# Patient Record
Sex: Female | Born: 1975 | Race: Black or African American | Hispanic: No | Smoking: Former smoker
Health system: Southern US, Community
[De-identification: ages and names within clinical notes are randomized; demographics above are authoritative.]

## PROBLEM LIST (undated history)

## (undated) DIAGNOSIS — Z789 Other specified health status: Secondary | ICD-10-CM

## (undated) HISTORY — PX: NO PAST SURGERIES: SHX2092

---

## 2016-09-23 ENCOUNTER — Encounter (HOSPITAL_BASED_OUTPATIENT_CLINIC_OR_DEPARTMENT_OTHER): Payer: Self-pay | Admitting: Emergency Medicine

## 2016-09-23 ENCOUNTER — Emergency Department (HOSPITAL_BASED_OUTPATIENT_CLINIC_OR_DEPARTMENT_OTHER): Payer: Medicaid Other

## 2016-09-23 ENCOUNTER — Emergency Department (HOSPITAL_BASED_OUTPATIENT_CLINIC_OR_DEPARTMENT_OTHER)
Admission: EM | Admit: 2016-09-23 | Discharge: 2016-09-23 | Disposition: A | Discharge status: Home / Self Care | Payer: Medicaid Other | Attending: Emergency Medicine | Admitting: Emergency Medicine

## 2016-09-23 DIAGNOSIS — Z3A11 11 weeks gestation of pregnancy: Secondary | ICD-10-CM | POA: Diagnosis not present

## 2016-09-23 DIAGNOSIS — O99011 Anemia complicating pregnancy, first trimester: Secondary | ICD-10-CM | POA: Insufficient documentation

## 2016-09-23 DIAGNOSIS — O200 Threatened abortion: Secondary | ICD-10-CM | POA: Insufficient documentation

## 2016-09-23 DIAGNOSIS — O209 Hemorrhage in early pregnancy, unspecified: Secondary | ICD-10-CM | POA: Diagnosis present

## 2016-09-23 LAB — WET PREP, GENITAL
CLUE CELLS WET PREP: NONE SEEN
Sperm: NONE SEEN
Trich, Wet Prep: NONE SEEN
YEAST WET PREP: NONE SEEN

## 2016-09-23 LAB — ABO/RH: ABO/RH(D): O POS

## 2016-09-23 LAB — BASIC METABOLIC PANEL
Anion gap: 8 (ref 5–15)
BUN: 12 mg/dL (ref 6–20)
CALCIUM: 8.8 mg/dL — AB (ref 8.9–10.3)
CO2: 22 mmol/L (ref 22–32)
Chloride: 106 mmol/L (ref 101–111)
Creatinine, Ser: 0.84 mg/dL (ref 0.44–1.00)
GLUCOSE: 104 mg/dL — AB (ref 65–99)
POTASSIUM: 3.7 mmol/L (ref 3.5–5.1)
SODIUM: 136 mmol/L (ref 135–145)

## 2016-09-23 LAB — CBC WITH DIFFERENTIAL/PLATELET
BASOS ABS: 0 10*3/uL (ref 0.0–0.1)
BASOS PCT: 0 %
Eosinophils Absolute: 0.2 10*3/uL (ref 0.0–0.7)
Eosinophils Relative: 3 %
HEMATOCRIT: 34.6 % — AB (ref 36.0–46.0)
Hemoglobin: 11.4 g/dL — ABNORMAL LOW (ref 12.0–15.0)
LYMPHS PCT: 40 %
Lymphs Abs: 3.2 10*3/uL (ref 0.7–4.0)
MCH: 25.4 pg — ABNORMAL LOW (ref 26.0–34.0)
MCHC: 32.9 g/dL (ref 30.0–36.0)
MCV: 77.1 fL — ABNORMAL LOW (ref 78.0–100.0)
MONO ABS: 0.6 10*3/uL (ref 0.1–1.0)
Monocytes Relative: 8 %
NEUTROS ABS: 3.9 10*3/uL (ref 1.7–7.7)
NEUTROS PCT: 49 %
Platelets: 327 10*3/uL (ref 150–400)
RBC: 4.49 MIL/uL (ref 3.87–5.11)
RDW: 17.7 % — AB (ref 11.5–15.5)
WBC: 8 10*3/uL (ref 4.0–10.5)

## 2016-09-23 LAB — HCG, QUANTITATIVE, PREGNANCY: hCG, Beta Chain, Quant, S: 7020 m[IU]/mL — ABNORMAL HIGH (ref <5)

## 2016-09-23 LAB — URINALYSIS, ROUTINE W REFLEX MICROSCOPIC
Bilirubin Urine: NEGATIVE
GLUCOSE, UA: NEGATIVE mg/dL
KETONES UR: NEGATIVE mg/dL
Nitrite: NEGATIVE
PROTEIN: NEGATIVE mg/dL
Specific Gravity, Urine: 1.009 (ref 1.005–1.030)
pH: 6 (ref 5.0–8.0)

## 2016-09-23 LAB — URINALYSIS, MICROSCOPIC (REFLEX)

## 2016-09-23 MED ORDER — PRENATAL COMPLETE 14-0.4 MG PO TABS: 1.0000 | ORAL_TABLET | Freq: Every day | ORAL | 0 refills | Status: AC

## 2016-09-23 NOTE — ED Triage Notes (Signed)
Pt states she is [redacted] weeks pregnant per [facility]. Pt states tonight she went to bathroom and there was blood on tissue.

## 2016-09-23 NOTE — ED Provider Notes (Signed)
[facility] Provider Note   CSN: [id number] Arrival date & time: 09/23/16  0425     History   Chief Complaint Chief Complaint  Patient presents with  . Vaginal Bleeding    HPI Jasmine Douglas is a 41 y.o. female.  The history is provided by the patient.  She is pregnant with last menstrual period 07/06/2016-confirmed by test [facility]. She is gravida 2, para 0 with one voluntary and option of pregnancy. She is scheduled to start prenatal care next week. Tonight, she urinated and noted some blood on the tissue paper when she wiped. She denies any pelvic pain or cramping, but has had some slight comfortably in the left inguinal area. She relates that she flew from [place] into [place] yesterday. She states she is blood type O+.  History reviewed. No pertinent past medical history.  There are no active problems to display for this patient.   History reviewed. No pertinent surgical history.  OB History    Gravida Para Term Preterm AB Living   1             SAB TAB Ectopic Multiple Live Births                   Home Medications    Prior to Admission medications   Not on File    Family History No family history on file.  Social History Social History  Substance Use Topics  . Smoking status: Not on file  . Smokeless tobacco: Not on file  . Alcohol use Not on file     Allergies   Aspirin   Review of Systems Review of Systems  All other systems reviewed and are negative.    Physical Exam Updated Vital Signs BP (!) 164/114 (BP Location: Right Arm)   Pulse 87   Temp 98.6 F (37 C) (Oral)   Resp 16   SpO2 100%   Physical Exam  Nursing note and vitals reviewed.  41 year old female, resting comfortably and in no acute distress. Vital signs are significant for hypertension. Oxygen saturation is 100%, which is normal. Head is normocephalic and atraumatic. PERRLA, EOMI. Oropharynx is clear. Neck is nontender and supple  without adenopathy or JVD. Back is nontender and there is no CVA tenderness. Lungs are clear without rales, wheezes, or rhonchi. Chest is nontender. Heart has regular rate and rhythm without murmur. Abdomen is soft, flat, nontender without masses or hepatosplenomegaly and peristalsis is normoactive. Pelvic: Normal external female genitalia. Small amount of brownish debris is present in the vaginal vault. Cervix is closed. No active bleeding. Fundus is approximately 12 weeks size and nontender. No adnexal masses or tenderness. Extremities have no cyanosis or edema, full range of motion is present. Skin is warm and dry without rash. Neurologic: Mental status is normal, cranial nerves are intact, there are no motor or sensory deficits.  ED Treatments / Results  Labs (all labs ordered are listed, but only abnormal results are displayed) Labs Reviewed  WET PREP, GENITAL - Abnormal; Notable for the following:       Result Value   WBC, Wet Prep HPF POC MANY (*)    All other components within normal limits  BASIC METABOLIC PANEL - Abnormal; Notable for the following:    Glucose, Bld 104 (*)    Calcium 8.8 (*)    All other components within normal limits  CBC WITH DIFFERENTIAL/PLATELET - Abnormal; Notable for the following:    Hemoglobin 11.4 (*)  HCT 34.6 (*)    MCV 77.1 (*)    MCH 25.4 (*)    RDW 17.7 (*)    All other components within normal limits  HCG, QUANTITATIVE, PREGNANCY - Abnormal; Notable for the following:    hCG, Beta Chain, Quant, S 7,020 (*)    All other components within normal limits  URINALYSIS, ROUTINE W REFLEX MICROSCOPIC - Abnormal; Notable for the following:    APPearance CLOUDY (*)    Hgb urine dipstick LARGE (*)    Leukocytes, UA TRACE (*)    All other components within normal limits  URINALYSIS, MICROSCOPIC (REFLEX) - Abnormal; Notable for the following:    Bacteria, UA FEW (*)    Squamous Epithelial / LPF 6-30 (*)    All other components within normal  limits  RPR  HIV ANTIBODY (ROUTINE TESTING)  ABO/RH  GC/CHLAMYDIA PROBE AMP ([place]) NOT AT [facility]    Radiology No results found.  Procedures Procedures (including critical care time)  Medications Ordered in ED Medications - No data to display   Initial Impression / Assessment and Plan / ED Course  I have reviewed the triage vital signs and the nursing notes.  Pertinent labs & imaging results that were available during my care of the patient were reviewed by me and considered in my medical decision making (see chart for details).  First trimester bleeding. We'll check quantitative hCG and will send for pelvic ultrasound. Case is signed out to Dr. Judd Lien to evaluate ultrasound results.  Final Clinical Impressions(s) / ED Diagnoses   Final diagnoses:  Microcytic anemia    New Prescriptions New Prescriptions   PRENATAL VIT-FE FUMARATE-FA (PRENATAL COMPLETE) 14-0.4 MG TABS    Take 1 tablet by mouth daily.     Dione Booze, MD 09/23/16 [phone]

## 2016-09-23 NOTE — ED Notes (Signed)
ED Provider at bedside. 

## 2016-09-23 NOTE — ED Notes (Signed)
Awaiting U/S results , husband at bedside

## 2016-09-23 NOTE — ED Notes (Signed)
Patient denies pain and is resting comfortably.  

## 2016-09-23 NOTE — ED Notes (Signed)
Pt ambulatory to bathroom. Pt and visitor offered drink and snack. Pt will wait in ED until US arrives. Pt expresses no other needs at this time.

## 2016-09-23 NOTE — ED Provider Notes (Signed)
Care assumed from Dr. Preston FleetingGlick at shift change. Patient is a 41 year old female first trimester pregnant who presents with vaginal bleeding. Her beta hCG is 7000 and is awaiting an ultrasound to rule out ectopic. This was performed and shows a fetal pole, however no identifiable cardiac activity concerning for failed pregnancy.  I have informed the patient of the ultrasound findings and have advised her To make an appointment wih her OB in the next 3 days for repeat beta hCG and possible repeat ultrasound. I have advised vaginal rest and no strenuous activity in the interim.   Jasmine Douglas, Jasmine [place], MD 09/23/16 [phone]

## 2016-09-23 NOTE — ED Notes (Signed)
Patient transported to Ultrasound 

## 2016-09-24 ENCOUNTER — Inpatient Hospital Stay (HOSPITAL_COMMUNITY)
Admission: AD | Admit: 2016-09-24 | Discharge: 2016-09-24 | Disposition: A | Discharge status: Home / Self Care | Payer: Medicaid Other | Source: Ambulatory Visit | Attending: Family Medicine | Admitting: Family Medicine

## 2016-09-24 ENCOUNTER — Encounter (HOSPITAL_COMMUNITY): Payer: Self-pay

## 2016-09-24 ENCOUNTER — Inpatient Hospital Stay (HOSPITAL_COMMUNITY): Payer: Medicaid Other

## 2016-09-24 DIAGNOSIS — O209 Hemorrhage in early pregnancy, unspecified: Secondary | ICD-10-CM | POA: Diagnosis present

## 2016-09-24 DIAGNOSIS — O4692 Antepartum hemorrhage, unspecified, second trimester: Secondary | ICD-10-CM

## 2016-09-24 DIAGNOSIS — O4690 Antepartum hemorrhage, unspecified, unspecified trimester: Secondary | ICD-10-CM

## 2016-09-24 DIAGNOSIS — O039 Complete or unspecified spontaneous abortion without complication: Secondary | ICD-10-CM | POA: Diagnosis not present

## 2016-09-24 DIAGNOSIS — Z87891 Personal history of nicotine dependence: Secondary | ICD-10-CM | POA: Diagnosis not present

## 2016-09-24 DIAGNOSIS — Z6790 Unspecified blood type, Rh positive: Secondary | ICD-10-CM

## 2016-09-24 HISTORY — DX: Other specified health status: Z78.9

## 2016-09-24 LAB — GC/CHLAMYDIA PROBE AMP (~~LOC~~) NOT AT ARMC
CHLAMYDIA, DNA PROBE: NEGATIVE
Neisseria Gonorrhea: NEGATIVE

## 2016-09-24 LAB — HIV ANTIBODY (ROUTINE TESTING W REFLEX): HIV SCREEN 4TH GENERATION: NONREACTIVE

## 2016-09-24 LAB — HCG, QUANTITATIVE, PREGNANCY: hCG, Beta Chain, Quant, S: 3806 m[IU]/mL — ABNORMAL HIGH (ref <5)

## 2016-09-24 LAB — CBC
HEMATOCRIT: 36.3 % (ref 36.0–46.0)
HEMOGLOBIN: 12.2 g/dL (ref 12.0–15.0)
MCH: 25.6 pg — ABNORMAL LOW (ref 26.0–34.0)
MCHC: 33.6 g/dL (ref 30.0–36.0)
MCV: 76.3 fL — ABNORMAL LOW (ref 78.0–100.0)
Platelets: 342 10*3/uL (ref 150–400)
RBC: 4.76 MIL/uL (ref 3.87–5.11)
RDW: 17.4 % — AB (ref 11.5–15.5)
WBC: 12.8 10*3/uL — AB (ref 4.0–10.5)

## 2016-09-24 LAB — RPR: RPR Ser Ql: NONREACTIVE

## 2016-09-24 MED ORDER — IBUPROFEN 600 MG PO TABS
600.0000 mg | ORAL_TABLET | Freq: Four times a day (QID) | ORAL | Status: DC | PRN
  Administered 2016-09-24: 600 mg via ORAL
  Filled 2016-09-24: qty 1

## 2016-09-24 NOTE — Discharge Instructions (Signed)

## 2016-09-24 NOTE — MAU Note (Signed)
Pt received by EMS with c/o vaginal bleeding which started about 24 hours ago. Having cramping now as well. Was seen yesterday morning at [facility] [place], US done. "Sac visualized in the uterus but no heartbeat." Did blood work and urine test as well.

## 2016-09-24 NOTE — MAU Provider Note (Signed)
History     CSN: [id number]  Arrival date and time: 09/24/16 [id number]   First Provider Initiated Contact with Patient 09/24/16 [phone]      Chief Complaint  Patient presents with  . Vaginal Bleeding   [id number] @[redacted]w[redacted]d  by sure LMP here with lower abdominal cramping since 11 pm. Rates pain 7/10. Took Tylenol and had no relief. She also reports heavy VB since yesterday. She was seen in another ED yesterday for VB and found to have IUP with CRL 6868w0d and no cardiac activity.   OB History    Gravida Para Term Preterm AB Living   2       1     SAB TAB Ectopic Multiple Live Births     1            Past Medical History:  Diagnosis Date  . Medical history non-contributory     Past Surgical History:  Procedure Laterality Date  . NO PAST SURGERIES      No family history on file.  Social History  Substance Use Topics  . Smoking status: Former Games developermoker  . Smokeless tobacco: Never Used  . Alcohol use No    Allergies:  Allergies  Allergen Reactions  . Aspirin Other (See Comments)    GI upset    Prescriptions Prior to Admission  Medication Sig Dispense Refill Last Dose  . Prenatal Vit-Fe Fumarate-FA (PRENATAL COMPLETE) 14-0.4 MG TABS Take 1 tablet by mouth daily. 30 each 0     Review of Systems  Constitutional: Negative for fever.  Gastrointestinal: Positive for abdominal pain, nausea and vomiting.  Genitourinary: Positive for vaginal bleeding.   Physical Exam   Blood pressure 139/73, pulse 67, temperature 98.8 F (37.1 C), temperature source Oral, resp. rate 18, last menstrual period 07/04/2016, SpO2 100 %.  Physical Exam  Constitutional: She is oriented to person, place, and time. She appears well-developed and well-nourished. No distress.  HENT:  Head: Normocephalic and atraumatic.  Neck: Normal range of motion.  Cardiovascular: Normal rate.   Respiratory: Effort normal.  GI: Soft. She exhibits no distension. There is no tenderness.  Genitourinary:  Genitourinary  Comments: External: no lesions or erythema Vagina: rugated, nulli, small drk bloody discharge, POC protruding from introitus Uterus: + enlarged, anteverted, non tender, no CMT Adnexae: no masses, no tenderness left, no tenderness right   Musculoskeletal: Normal range of motion.  Neurological: She is alert and oriented to person, place, and time.  Skin: Skin is warm and dry.  Psychiatric: She has a normal mood and affect.   Results for orders placed or performed during the hospital encounter of 09/24/16 (from the past 24 hour(s))  CBC     Status: Abnormal   Collection Time: 09/24/16  7:38 AM  Result Value Ref Range   WBC 12.8 (H) 4.0 - 10.5 K/uL   RBC 4.76 3.87 - 5.11 MIL/uL   Hemoglobin 12.2 12.0 - 15.0 g/dL   HCT 47.836.3 29.536.0 - 62.146.0 %   MCV 76.3 (L) 78.0 - 100.0 fL   MCH 25.6 (L) 26.0 - 34.0 pg   MCHC 33.6 30.0 - 36.0 g/dL   RDW 30.817.4 (H) 65.711.5 - 84.615.5 %   Platelets 342 150 - 400 K/uL   [place] Ob Comp Less 14 Wks  Result Date: 09/23/2016 CLINICAL DATA:  Vaginal spotting EXAM: OBSTETRIC <14 WK US AND TRANSVAGINAL OB US TECHNIQUE: Both transabdominal and transvaginal ultrasound examinations were performed for complete evaluation of the gestation as well as the maternal uterus,  adnexal regions, and pelvic cul-de-sac. Transvaginal technique was performed to assess early pregnancy. COMPARISON:  None. FINDINGS: Intrauterine gestational sac: Visualized Yolk sac:  Not visualized Embryo:  Visualized Cardiac Activity: Not visualized CRL:  3  mm   6 w   0 d Subchorionic hemorrhage:  None visualized. Maternal uterus/adnexae: Leiomyomatous change is noted in the uterus. In the posterior fundal region, there is a 5.4 x 4.2 x 5.9 cm presumed leiomyoma. Toward the left, there is a 2.7 x 1.7 x 0.0 cm mass. More anteriorly, there is a 1.9 x 1.1 cm intrauterine mass, felt to be consistent with leiomyoma. Neither maternal ovary visualized. No extrauterine mass evident. No free pelvic fluid. IMPRESSION: Within the  uterus, there is a small fetal pole measuring 3 mm in length without heart activity apparent. Findings are suspicious but not yet definitive for failed pregnancy. Recommend follow-up US in 10-14 days for definitive diagnosis. This recommendation follows SRU consensus guidelines: Diagnostic Criteria for Nonviable Pregnancy Early in the First Trimester. Malva Limes Med 2013; 161:0960-45. Based on crown-rump length, estimated gestational age is 6 weeks. Leiomyomatous uterus as noted above. No subchorionic hemorrhage evident. Neither maternal ovary could be visualized. Electronically Signed   By: Bretta Bang III M.D.   On: 09/23/2016 08:43   US Ob Transvaginal  Result Date: 09/24/2016 CLINICAL DATA:  Pregnant, vaginal bleeding and pain, follow-up, having a lot of pain and earlier passed a large clot EXAM: TRANSVAGINAL OB ULTRASOUND TECHNIQUE: Transvaginal ultrasound was performed for complete evaluation of the gestation as well as the maternal uterus, adnexal regions, and pelvic cul-de-sac. COMPARISON:  09/23/2016 FINDINGS: Intrauterine gestational sac: Absent, no longer identified Yolk sac:  N/A Embryo:  N/A Cardiac Activity: N/A Heart Rate: N/A bpm Maternal uterus/adnexae: Thickened endometrial complex with previously identified gestational sac no longer visualized. Neither ovary is visualized question due to obscuration by bowel as well as mildly enlarged uterus. No free pelvic fluid or adnexal masses. IMPRESSION: Previously identified gestational sac is no longer seen consistent with spontaneous abortion. Thickened and mildly heterogeneous endometrial complex, may contain small amount of blood. Electronically Signed   By: Ulyses Southward M.D.   On: 09/24/2016 07:52   US Ob Transvaginal  Result Date: 09/23/2016 CLINICAL DATA:  Vaginal spotting EXAM: OBSTETRIC <14 WK [place] AND TRANSVAGINAL OB US TECHNIQUE: Both transabdominal and transvaginal ultrasound examinations were performed for complete evaluation of the  gestation as well as the maternal uterus, adnexal regions, and pelvic cul-de-sac. Transvaginal technique was performed to assess early pregnancy. COMPARISON:  None. FINDINGS: Intrauterine gestational sac: Visualized Yolk sac:  Not visualized Embryo:  Visualized Cardiac Activity: Not visualized CRL:  3  mm   6 w   0 d Subchorionic hemorrhage:  None visualized. Maternal uterus/adnexae: Leiomyomatous change is noted in the uterus. In the posterior fundal region, there is a 5.4 x 4.2 x 5.9 cm presumed leiomyoma. Toward the left, there is a 2.7 x 1.7 x 0.0 cm mass. More anteriorly, there is a 1.9 x 1.1 cm intrauterine mass, felt to be consistent with leiomyoma. Neither maternal ovary visualized. No extrauterine mass evident. No free pelvic fluid. IMPRESSION: Within the uterus, there is a small fetal pole measuring 3 mm in length without heart activity apparent. Findings are suspicious but not yet definitive for failed pregnancy. Recommend follow-up US in 10-14 days for definitive diagnosis. This recommendation follows SRU consensus guidelines: Diagnostic Criteria for Nonviable Pregnancy Early in the First Trimester. Malva Limes Med 2013; 409:8119-14. Based on crown-rump  length, estimated gestational age is 6 weeks. Leiomyomatous uterus as noted above. No subchorionic hemorrhage evident. Neither maternal ovary could be visualized. Electronically Signed   By: Bretta Bang III M.D.   On: 09/23/2016 08:43   MAU Course  Procedures  MDM Labs and [place] ordered and reviewed. SAB complete. POCs sent to pathology. Stable for discharge home.  Assessment and Plan   1. SAB (spontaneous abortion)   2. Vaginal bleeding in pregnancy   3. Blood type, Rh positive    Discharge home Follow up with OB in [place] in 2 weeks Return/bleeding precautions Ibuprofen prn  Allergies as of 09/24/2016      Reactions   Aspirin Other (See Comments)   GI upset      Medication List    TAKE these medications   PRENATAL COMPLETE  14-0.4 MG Tabs Take 1 tablet by mouth daily.      Donette Larry, CNM 09/24/2016, 6:47 AM

## 2017-07-03 ENCOUNTER — Encounter (HOSPITAL_COMMUNITY): Payer: Self-pay

## 2018-07-20 IMAGING — US US OB TRANSVAGINAL
1 series · 15 of 24 positions shown · non-contrast
Comparison: 09/23/2016

CLINICAL DATA: Pregnant, vaginal bleeding and pain, follow-up,
having a lot of pain and earlier passed a large clot

EXAM:
TRANSVAGINAL OB ULTRASOUND
TECHNIQUE: Transvaginal ultrasound was performed for complete evaluation of the
gestation as well as the maternal uterus, adnexal regions, and
pelvic cul-de-sac.

[Series 1: us ob transvaginal · 24 acquisitions, 15 frames shown]
[im 1/24]
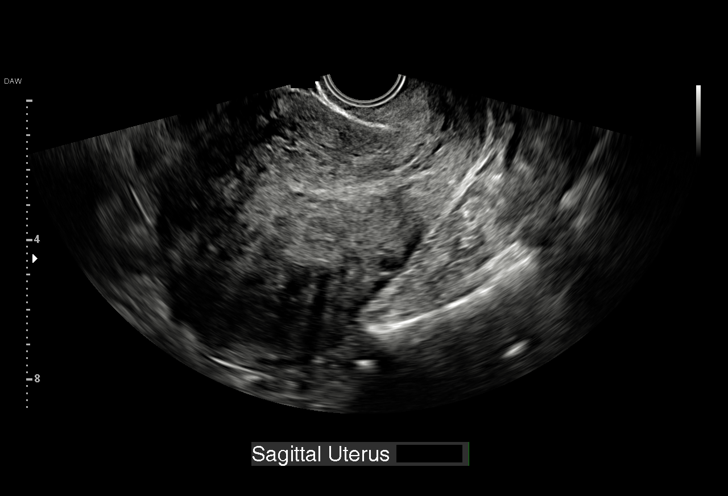
[im 3/24]
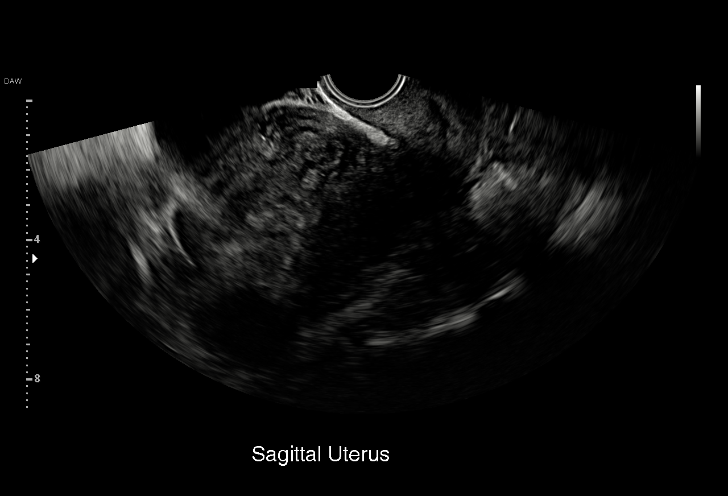
[im 5/24]
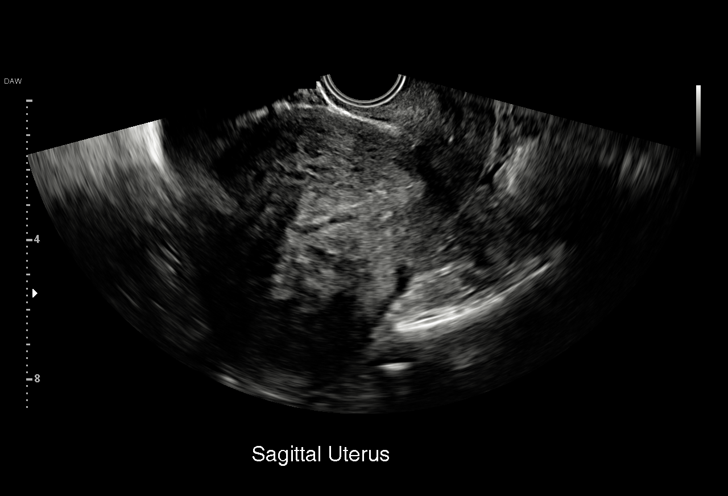
[im 6/24]
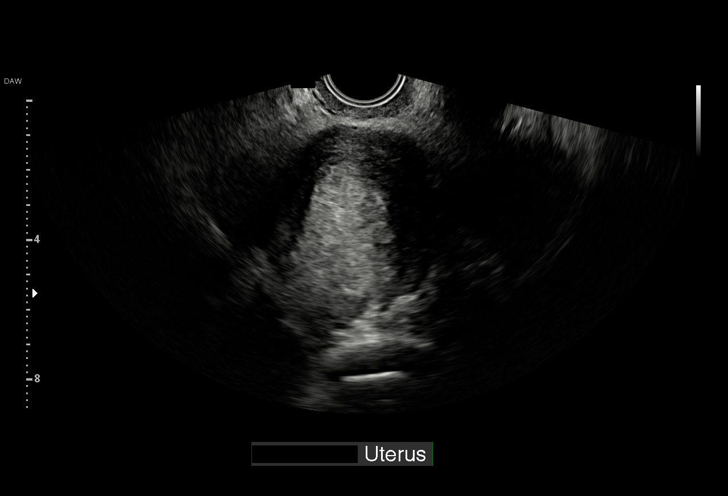
[im 8/24]
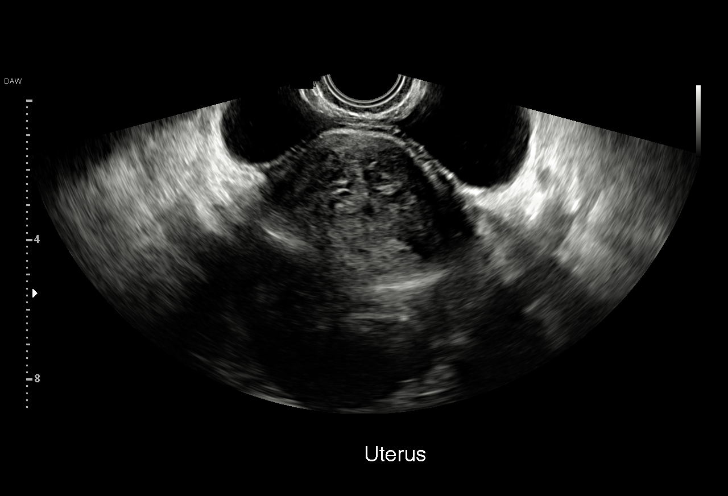
[im 9/24]
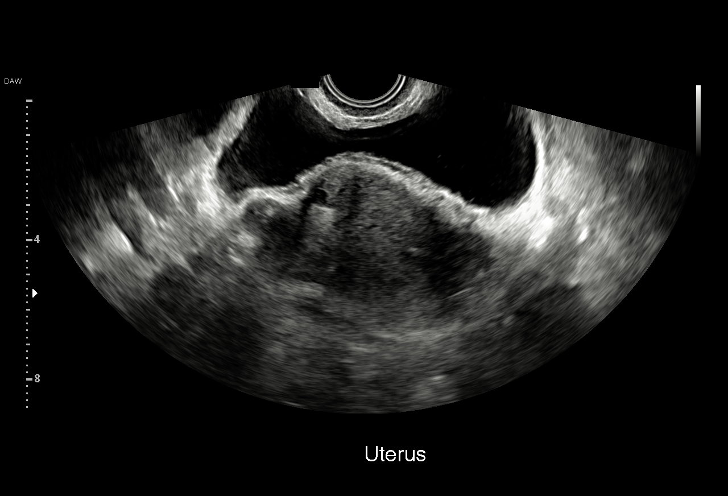
[im 11/24]
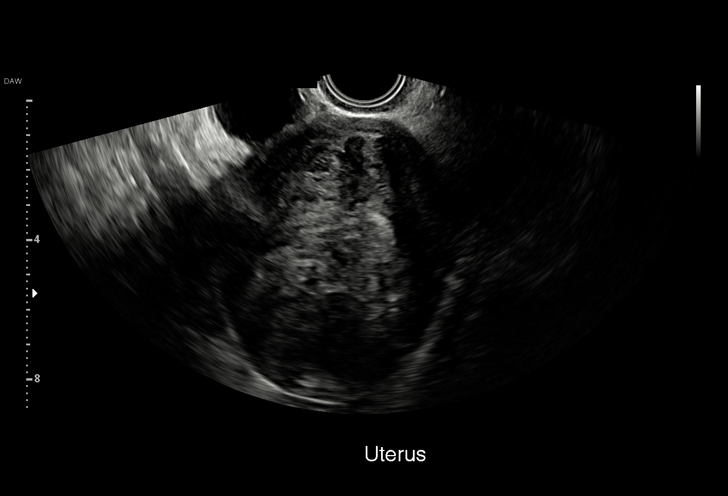
[im 13/24]
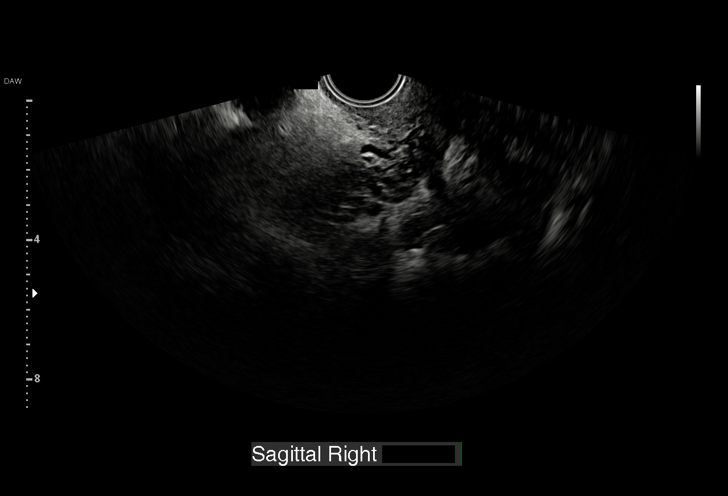
[im 14/24]
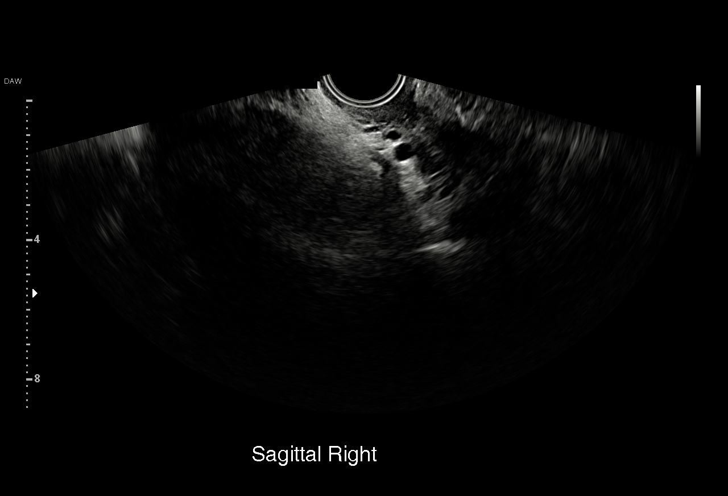
[im 16/24]
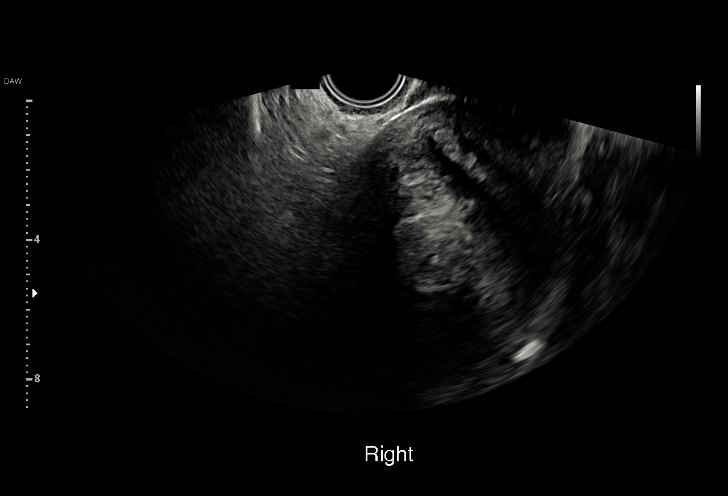
[im 17/24]
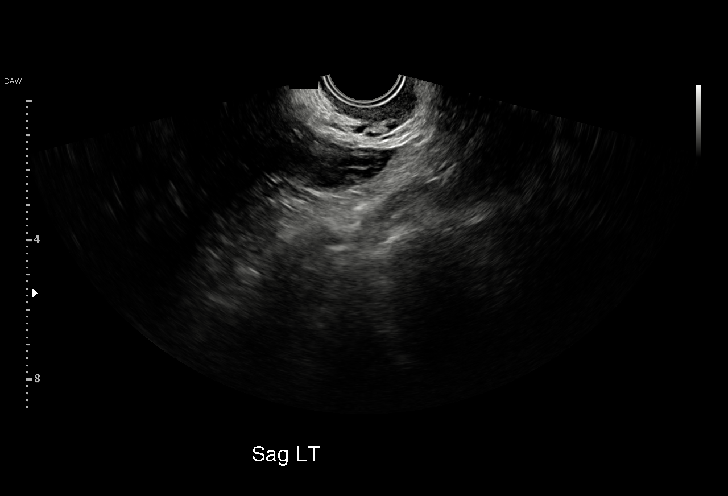
[im 19/24]
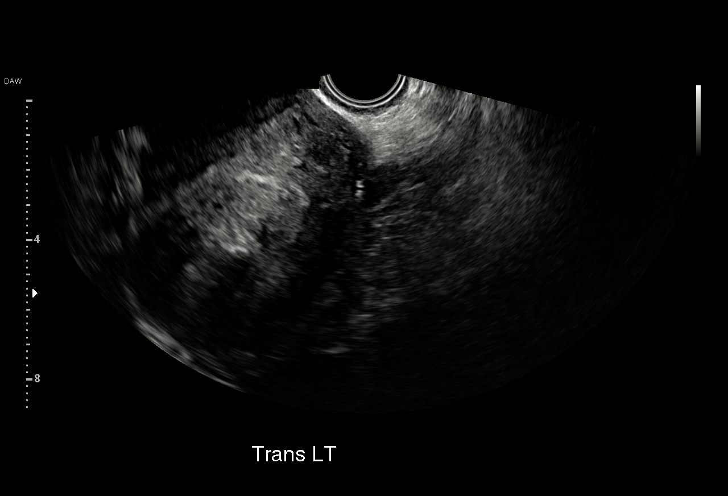
[im 21/24]
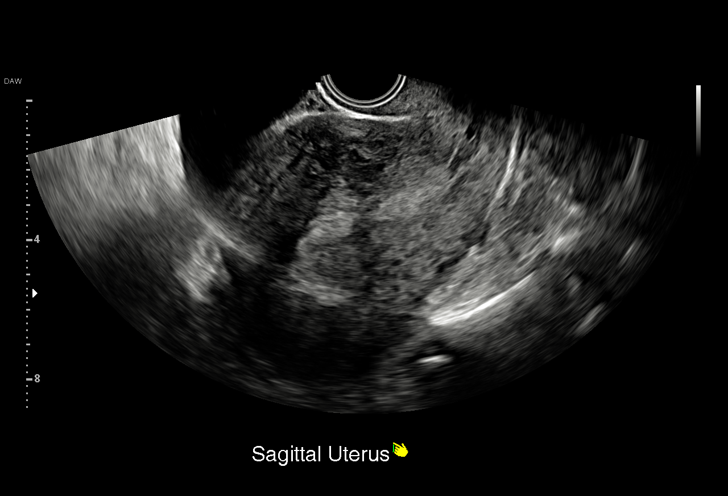
[im 22/24]
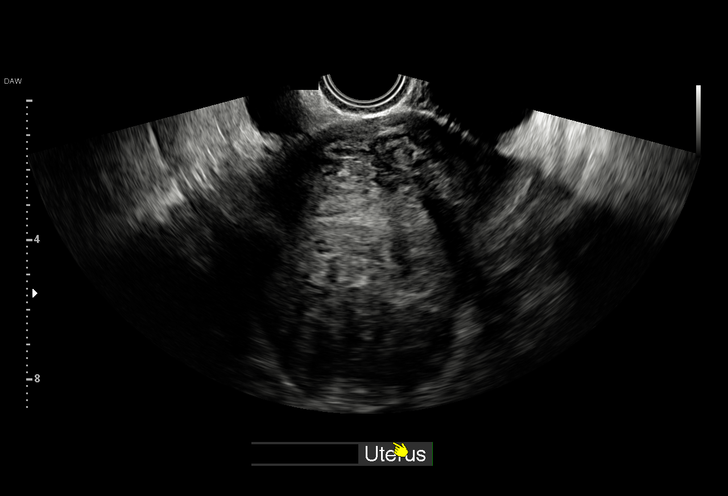
[im 24/24]
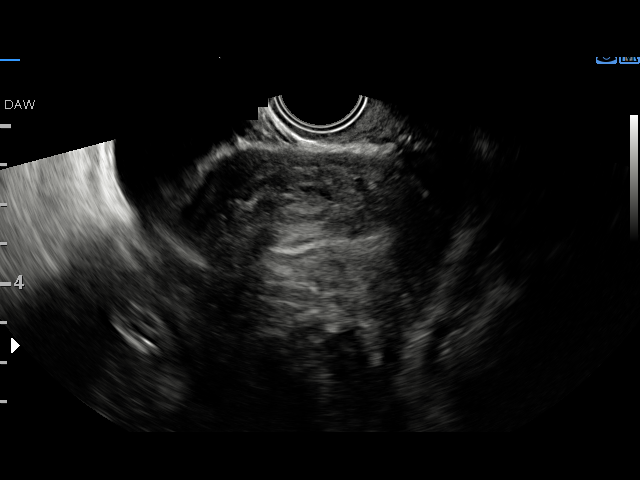

[15 of 24 positions shown; findings below may reference images not displayed]

FINDINGS: Intrauterine gestational sac: Absent, no longer identified

Yolk sac:  N/A

Embryo:  N/A

Cardiac Activity: N/A

Heart Rate: N/A bpm

Maternal uterus/adnexae:

Thickened endometrial complex with previously identified gestational
sac no longer visualized.

Neither ovary is visualized question due to obscuration by bowel as
well as mildly enlarged uterus.

No free pelvic fluid or adnexal masses.
IMPRESSION: Previously identified gestational sac is no longer seen consistent
with spontaneous abortion.

Thickened and mildly heterogeneous endometrial complex, may contain
small amount of blood.

## 2019-05-01 IMAGING — US US OB TRANSVAGINAL
1 series · 13 of 28 positions shown · non-contrast
Comparison: None.

CLINICAL DATA: Vaginal spotting

EXAM:
OBSTETRIC <14 WK US AND TRANSVAGINAL OB US
TECHNIQUE: Both transabdominal and transvaginal ultrasound examinations were
performed for complete evaluation of the gestation as well as the
maternal uterus, adnexal regions, and pelvic cul-de-sac.
Transvaginal technique was performed to assess early pregnancy.

[Series 1: us ob transvaginal · 0.21mm/px · 13 of 50 slices shown]
[im 2/50]
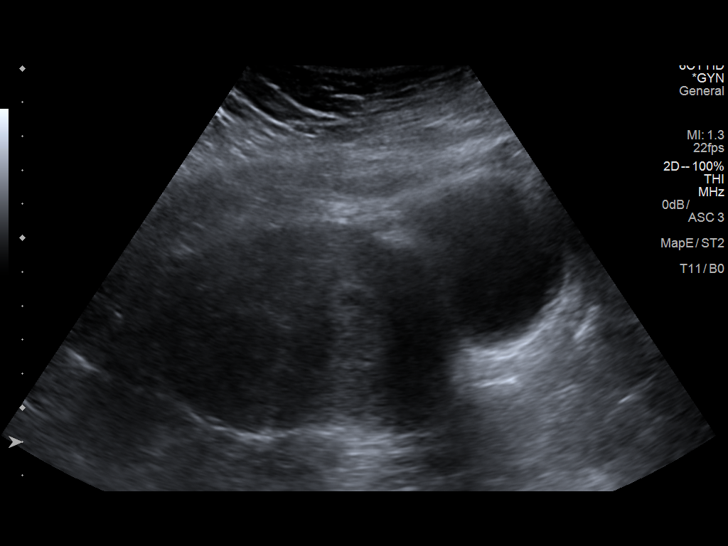
[im 6/50]
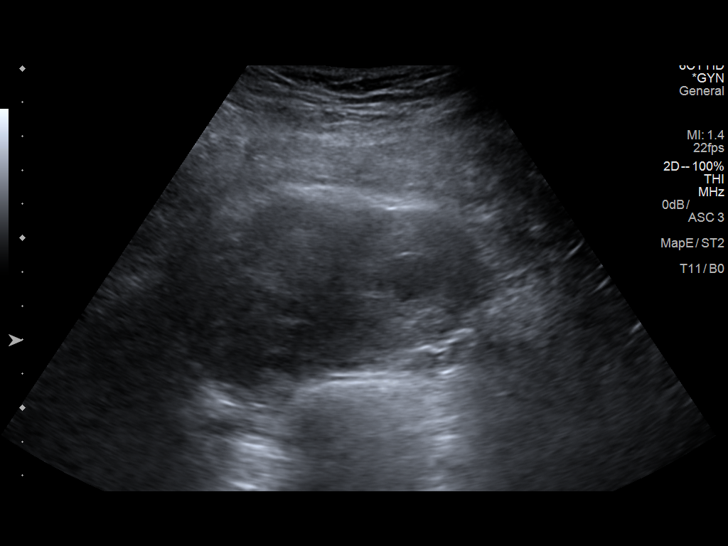
[im 10/50]
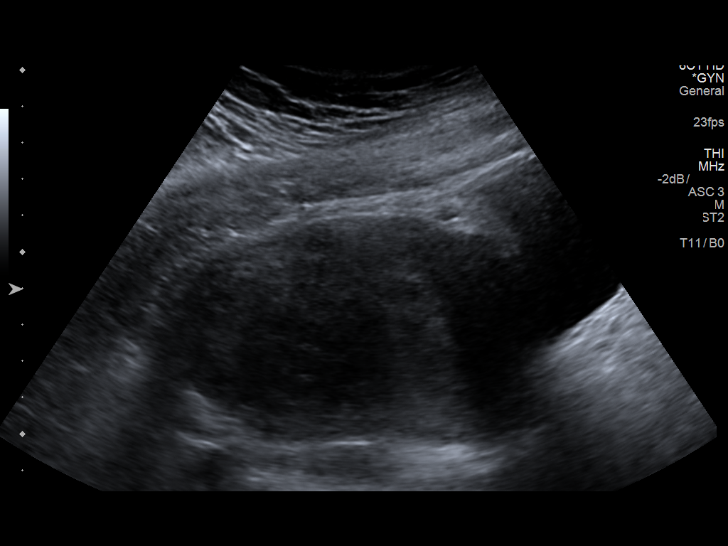
[im 13/50]
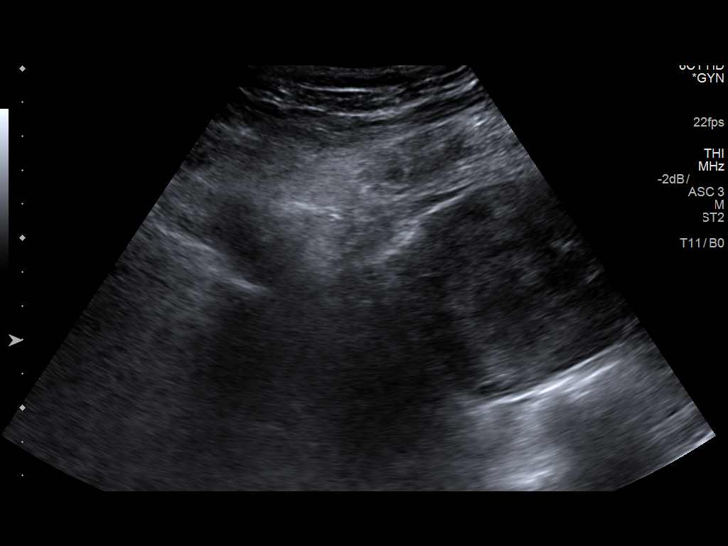
[im 17/50]
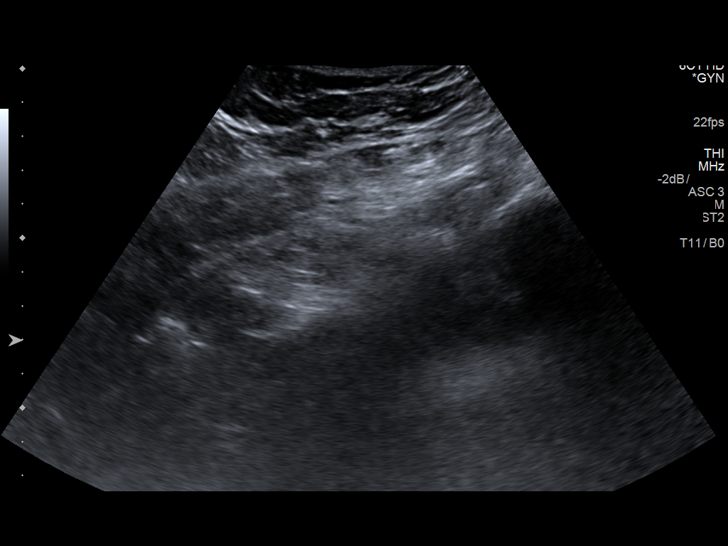
[im 20/50]
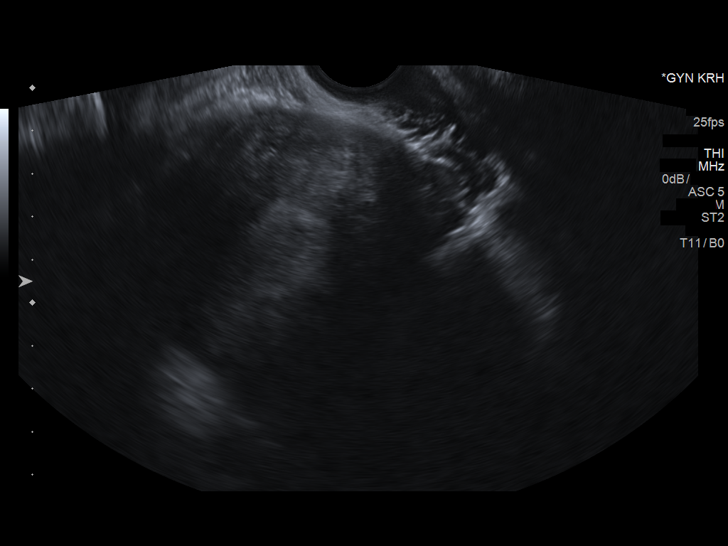
[im 26/50]
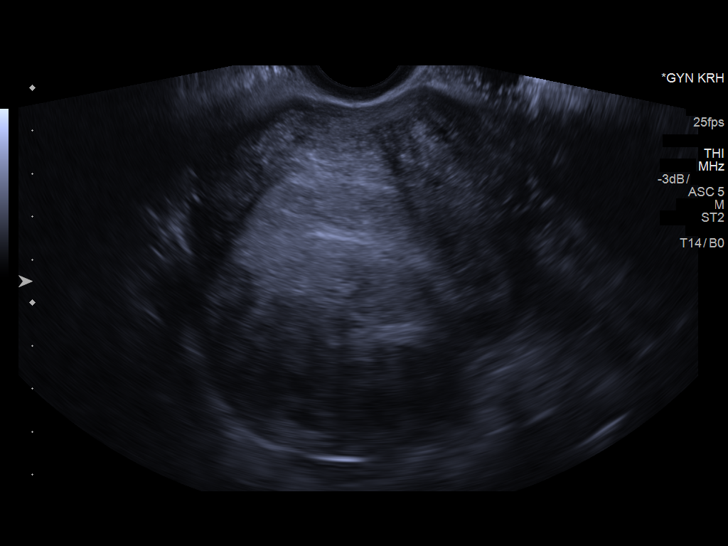
[im 30/50]
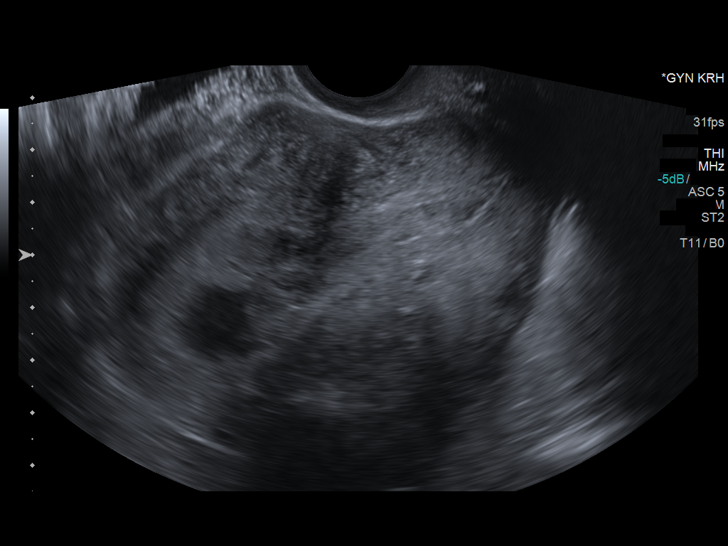
[im 33/50]
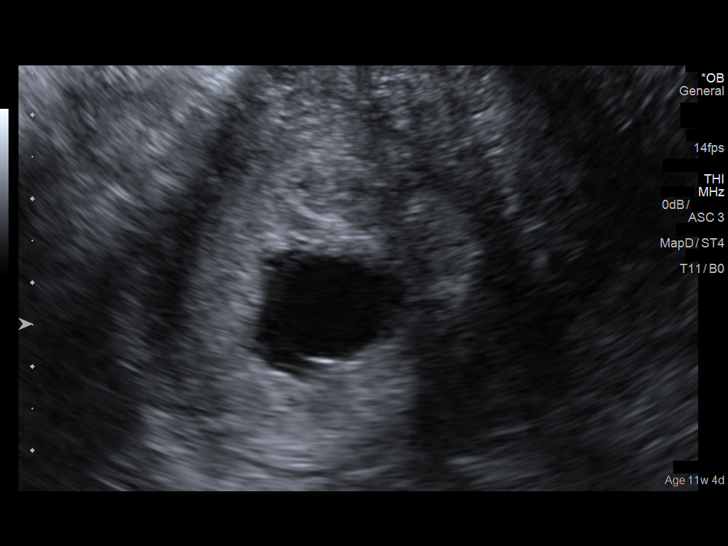
[im 37/50]
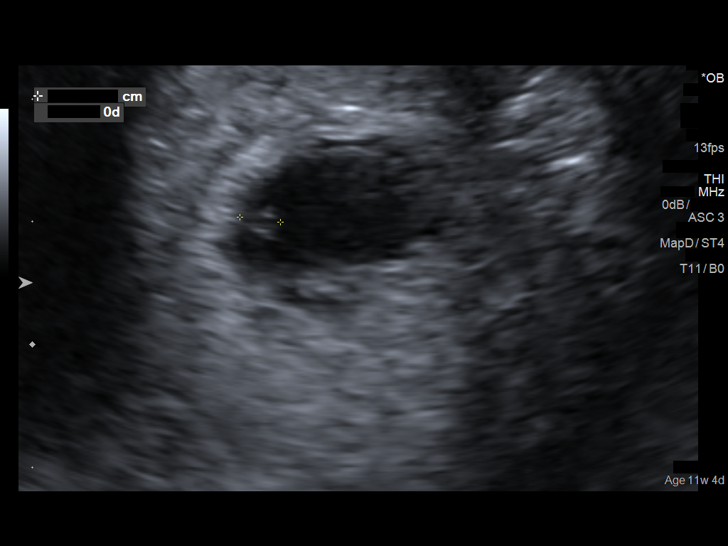
[im 40/50]
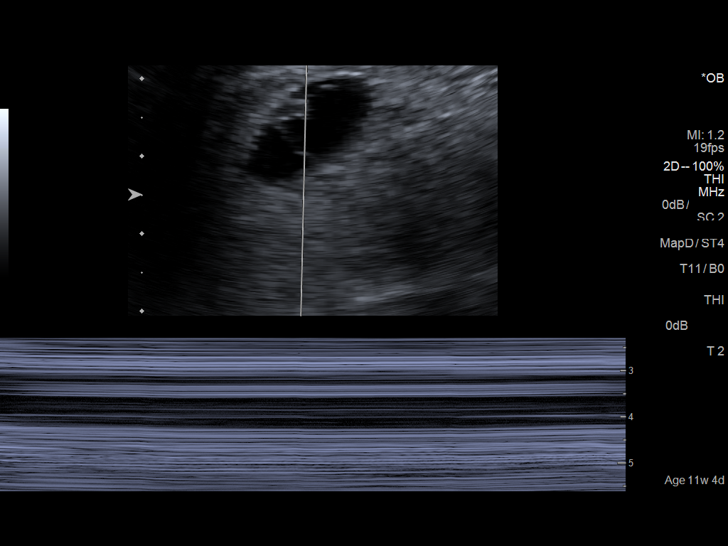
[im 44/50]
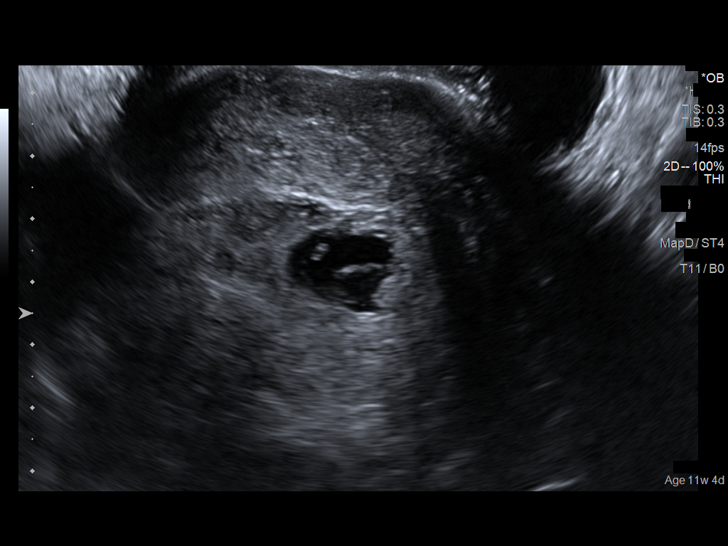
[im 48/50]
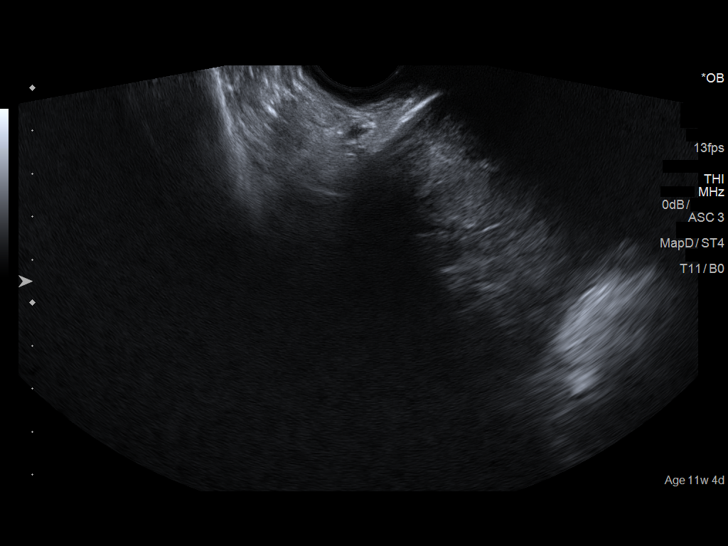

[13 of 28 positions shown; findings below may reference images not displayed]

FINDINGS: Intrauterine gestational sac: Visualized

Yolk sac:  Not visualized

Embryo:  Visualized

Cardiac Activity: Not visualized

CRL:  3  mm   6 w   0 d

Subchorionic hemorrhage:  None visualized.

Maternal uterus/adnexae: Leiomyomatous change is noted in the
uterus. In the posterior fundal region, there is a 5.4 x 4.2 x
cm presumed leiomyoma. Toward the left, there is a 2.7 x 1.7 x
cm mass. More anteriorly, there is a 1.9 x 1.1 cm intrauterine mass,
felt to be consistent with leiomyoma. Neither maternal ovary
visualized. No extrauterine mass evident. No free pelvic fluid.
IMPRESSION: Within the uterus, there is a small fetal pole measuring 3 mm in
length without heart activity apparent. Findings are suspicious but
not yet definitive for failed pregnancy. Recommend follow-up US in
10-14 days for definitive diagnosis. This recommendation follows SRU
consensus guidelines: Diagnostic Criteria for Nonviable Pregnancy
Early in the First Trimester. N Engl J Med 3370; [DATE]. Based
on crown-rump length, estimated gestational age is 6 weeks.

Leiomyomatous uterus as noted above. No subchorionic hemorrhage
evident. Neither maternal ovary could be visualized.
